# Patient Record
Sex: Male | Born: 1955 | Race: White | Hispanic: No | Marital: Married
Health system: Midwestern US, Academic
[De-identification: ages and names within clinical notes are randomized; demographics above are authoritative.]

---

## 2017-10-07 ENCOUNTER — Encounter: Admit: 2017-10-07 | Discharge: 2017-10-07 | Payer: MEDICARE

## 2017-10-09 MED ORDER — LISINOPRIL 20 MG PO TAB
20 mg | ORAL_TABLET | Freq: Every day | ORAL | 2 refills | Status: AC
Start: 2017-10-09 — End: 2017-12-28

## 2017-12-14 ENCOUNTER — Encounter: Admit: 2017-12-14 | Discharge: 2017-12-14 | Payer: MEDICARE

## 2017-12-28 ENCOUNTER — Encounter: Admit: 2017-12-28 | Discharge: 2017-12-28 | Payer: MEDICARE

## 2017-12-28 DIAGNOSIS — R002 Palpitations: ICD-10-CM

## 2017-12-28 DIAGNOSIS — E785 Hyperlipidemia, unspecified: Principal | ICD-10-CM

## 2017-12-28 DIAGNOSIS — I10 Essential (primary) hypertension: ICD-10-CM

## 2017-12-28 MED ORDER — LISINOPRIL 20 MG PO TAB
ORAL_TABLET | Freq: Every day | 0 refills | Status: AC
Start: 2017-12-28 — End: 2018-01-19

## 2017-12-29 ENCOUNTER — Encounter: Admit: 2017-12-29 | Discharge: 2017-12-29 | Payer: MEDICARE

## 2018-01-16 ENCOUNTER — Encounter: Admit: 2018-01-16 | Discharge: 2018-01-16 | Payer: MEDICARE

## 2018-01-19 ENCOUNTER — Encounter: Admit: 2018-01-19 | Discharge: 2018-01-19 | Payer: MEDICARE

## 2018-01-19 MED ORDER — LISINOPRIL 20 MG PO TAB
ORAL_TABLET | Freq: Every day | 0 refills | Status: AC
Start: 2018-01-19 — End: 2018-02-13

## 2018-02-13 ENCOUNTER — Encounter: Admit: 2018-02-13 | Discharge: 2018-02-13 | Payer: MEDICARE

## 2018-02-13 MED ORDER — LISINOPRIL 20 MG PO TAB
ORAL_TABLET | Freq: Every day | 0 refills | Status: AC
Start: 2018-02-13 — End: 2018-04-16

## 2018-02-14 LAB — COMPREHENSIVE METABOLIC PANEL: Lab: 136

## 2018-02-15 LAB — BASIC METABOLIC PANEL: Lab: 138 — ABNORMAL LOW (ref 4.70–6.10)

## 2018-02-15 LAB — TROPONIN-I

## 2018-02-15 LAB — CBC: Lab: 7.9

## 2018-02-16 ENCOUNTER — Encounter: Admit: 2018-02-16 | Discharge: 2018-02-16 | Payer: MEDICARE

## 2018-02-16 DIAGNOSIS — Z950 Presence of cardiac pacemaker: Principal | ICD-10-CM

## 2018-02-23 ENCOUNTER — Ambulatory Visit: Admit: 2018-02-23 | Discharge: 2018-02-24 | Payer: MEDICARE

## 2018-02-23 DIAGNOSIS — Z950 Presence of cardiac pacemaker: Principal | ICD-10-CM

## 2018-03-01 ENCOUNTER — Encounter: Admit: 2018-03-01 | Discharge: 2018-03-01 | Payer: MEDICARE

## 2018-03-01 DIAGNOSIS — Z950 Presence of cardiac pacemaker: Principal | ICD-10-CM

## 2018-03-08 ENCOUNTER — Encounter: Admit: 2018-03-08 | Discharge: 2018-03-08 | Payer: MEDICARE

## 2018-03-08 ENCOUNTER — Ambulatory Visit: Admit: 2018-03-08 | Discharge: 2018-03-09 | Payer: MEDICARE

## 2018-03-08 DIAGNOSIS — Z950 Presence of cardiac pacemaker: ICD-10-CM

## 2018-03-08 DIAGNOSIS — R079 Chest pain, unspecified: Principal | ICD-10-CM

## 2018-03-08 DIAGNOSIS — G20 Parkinson's disease: ICD-10-CM

## 2018-03-08 DIAGNOSIS — R002 Palpitations: ICD-10-CM

## 2018-03-08 DIAGNOSIS — E785 Hyperlipidemia, unspecified: ICD-10-CM

## 2018-03-08 DIAGNOSIS — G4730 Sleep apnea, unspecified: ICD-10-CM

## 2018-03-08 DIAGNOSIS — I471 Supraventricular tachycardia: Principal | ICD-10-CM

## 2018-03-08 DIAGNOSIS — F79 Unspecified intellectual disabilities: ICD-10-CM

## 2018-03-08 DIAGNOSIS — I10 Essential (primary) hypertension: ICD-10-CM

## 2018-03-08 MED ORDER — METOPROLOL TARTRATE 25 MG PO TAB
25 mg | ORAL_TABLET | Freq: Two times a day (BID) | ORAL | 3 refills | 90.00000 days | Status: AC
Start: 2018-03-08 — End: 2018-05-22

## 2018-03-28 ENCOUNTER — Encounter: Admit: 2018-03-28 | Discharge: 2018-03-28 | Payer: MEDICARE

## 2018-03-28 ENCOUNTER — Ambulatory Visit: Admit: 2018-03-28 | Discharge: 2018-03-29 | Payer: MEDICARE

## 2018-03-28 DIAGNOSIS — I471 Supraventricular tachycardia: Principal | ICD-10-CM

## 2018-03-28 LAB — LIPID PROFILE
Lab: 127
Lab: 132
Lab: 3.6
Lab: 37 — ABNORMAL LOW (ref 40–60)
Lab: 70
Lab: 95

## 2018-03-28 MED ORDER — REGADENOSON 0.4 MG/5 ML IV SYRG
.4 mg | Freq: Once | INTRAVENOUS | 0 refills | Status: CP
Start: 2018-03-28 — End: ?

## 2018-03-28 MED ORDER — PERFLUTREN LIPID MICROSPHERES 1.1 MG/ML IV SUSP
1-20 mL | Freq: Once | INTRAVENOUS | 0 refills | Status: CP | PRN
Start: 2018-03-28 — End: ?

## 2018-03-29 ENCOUNTER — Encounter: Admit: 2018-03-29 | Discharge: 2018-03-29 | Payer: MEDICARE

## 2018-03-30 ENCOUNTER — Encounter: Admit: 2018-03-30 | Discharge: 2018-03-30 | Payer: MEDICARE

## 2018-04-11 LAB — COMPREHENSIVE METABOLIC PANEL
Lab: 0.3
Lab: 98

## 2018-04-12 LAB — BASIC METABOLIC PANEL
Lab: 1.2
Lab: 104
Lab: 138
Lab: 14
Lab: 19
Lab: 24
Lab: 4.1
Lab: 63
Lab: 9.6
Lab: 99

## 2018-04-14 ENCOUNTER — Encounter: Admit: 2018-04-14 | Discharge: 2018-04-14 | Payer: MEDICARE

## 2018-04-16 MED ORDER — LISINOPRIL 20 MG PO TAB
ORAL_TABLET | Freq: Every day | 11 refills | Status: AC
Start: 2018-04-16 — End: 2019-05-14

## 2018-04-27 ENCOUNTER — Encounter: Admit: 2018-04-27 | Discharge: 2018-04-27 | Payer: MEDICARE

## 2018-05-10 ENCOUNTER — Encounter: Admit: 2018-05-10 | Discharge: 2018-05-10 | Payer: MEDICARE

## 2018-05-10 ENCOUNTER — Ambulatory Visit: Admit: 2018-05-10 | Discharge: 2018-05-11 | Payer: MEDICARE

## 2018-05-10 DIAGNOSIS — E785 Hyperlipidemia, unspecified: ICD-10-CM

## 2018-05-10 DIAGNOSIS — R001 Bradycardia, unspecified: Principal | ICD-10-CM

## 2018-05-10 DIAGNOSIS — G20 Parkinson's disease: ICD-10-CM

## 2018-05-10 DIAGNOSIS — R079 Chest pain, unspecified: Principal | ICD-10-CM

## 2018-05-10 DIAGNOSIS — F79 Unspecified intellectual disabilities: ICD-10-CM

## 2018-05-10 DIAGNOSIS — G4730 Sleep apnea, unspecified: ICD-10-CM

## 2018-05-10 DIAGNOSIS — R002 Palpitations: ICD-10-CM

## 2018-05-10 DIAGNOSIS — I10 Essential (primary) hypertension: ICD-10-CM

## 2018-05-11 ENCOUNTER — Encounter: Admit: 2018-05-11 | Discharge: 2018-05-11 | Payer: MEDICARE

## 2018-05-15 ENCOUNTER — Ambulatory Visit: Admit: 2018-05-15 | Discharge: 2018-05-16 | Payer: MEDICARE

## 2018-05-15 ENCOUNTER — Encounter: Admit: 2018-05-15 | Discharge: 2018-05-15 | Payer: MEDICARE

## 2018-05-15 DIAGNOSIS — Z950 Presence of cardiac pacemaker: Principal | ICD-10-CM

## 2018-05-16 ENCOUNTER — Encounter: Admit: 2018-05-16 | Discharge: 2018-05-16 | Payer: MEDICARE

## 2018-05-16 DIAGNOSIS — R001 Bradycardia, unspecified: Principal | ICD-10-CM

## 2018-05-22 ENCOUNTER — Encounter: Admit: 2018-05-22 | Discharge: 2018-05-22 | Payer: MEDICARE

## 2018-05-22 MED ORDER — METOPROLOL TARTRATE 25 MG PO TAB
ORAL_TABLET | Freq: Two times a day (BID) | ORAL | 3 refills | 90.00000 days | Status: AC
Start: 2018-05-22 — End: 2019-02-28

## 2018-11-13 ENCOUNTER — Ambulatory Visit: Admit: 2018-11-13 | Discharge: 2018-11-14 | Payer: MEDICARE

## 2018-11-13 ENCOUNTER — Encounter: Admit: 2018-11-13 | Discharge: 2018-11-13 | Payer: MEDICARE

## 2018-11-13 DIAGNOSIS — R001 Bradycardia, unspecified: Principal | ICD-10-CM

## 2018-11-15 ENCOUNTER — Ambulatory Visit: Admit: 2018-11-15 | Discharge: 2018-11-15 | Payer: MEDICARE

## 2018-11-15 ENCOUNTER — Encounter: Admit: 2018-11-15 | Discharge: 2018-11-15 | Payer: MEDICARE

## 2018-11-15 DIAGNOSIS — I10 Essential (primary) hypertension: ICD-10-CM

## 2018-11-15 DIAGNOSIS — R002 Palpitations: ICD-10-CM

## 2018-11-15 DIAGNOSIS — Z950 Presence of cardiac pacemaker: ICD-10-CM

## 2018-11-15 DIAGNOSIS — G20 Parkinson's disease: ICD-10-CM

## 2018-11-15 DIAGNOSIS — R079 Chest pain, unspecified: Principal | ICD-10-CM

## 2018-11-15 DIAGNOSIS — E785 Hyperlipidemia, unspecified: ICD-10-CM

## 2018-11-15 DIAGNOSIS — G4730 Sleep apnea, unspecified: ICD-10-CM

## 2018-11-15 DIAGNOSIS — F79 Unspecified intellectual disabilities: ICD-10-CM

## 2018-11-15 NOTE — Progress Notes
medications listed on this note should be accurate according to the patient and his caregiver. ???The patient reports no adverse effects to any the medications that he is currently taking.???         Vitals:    11/15/18 1439 11/15/18 1458   BP: 112/80 110/78   BP Source: Arm, Left Upper Arm, Right Upper   Pulse: 82    SpO2: 98%    Weight: 82.8 kg (182 lb 9.6 oz)    Height: 1.702 m (5' 7)    PainSc: Zero      Body mass index is 28.6 kg/m???.     Past Medical History  Patient Active Problem List    Diagnosis Date Noted   ??? Chest pain 06/14/2016   ??? Palpitations 06/14/2016   ??? Essential hypertension 06/14/2016   ??? Hyperlipidemia 06/14/2016   ??? Mental retardation 06/14/2016   ??? Parkinson disease (HCC) 06/14/2016   ??? Sleep apnea 06/14/2016     05/05/2015 Polysomnography with CPAP/BiPAP titration  Moderate Obstructive Sleep Apnea     ??? Pacemaker 06/14/2016     07/10/15 Insertion of dual chamber pacemaker implantation for Symptomatic sinus bradycardia  Medtronic Model: Advisa DR MRI ZO:[id number]   Atrial Lead: Medtronic Model: 5076-52cm, JW:[id number]  Ventricular Lead: Medtronic Model: 5076-58cm AO:[id number]      ??? Conductive hearing loss 07/31/2014         Review of Systems   Constitution: Positive for malaise/fatigue.   HENT: Negative.    Eyes: Negative.    Cardiovascular: Positive for dyspnea on exertion.   Respiratory: Negative.    Endocrine: Negative.    Hematologic/Lymphatic: Negative.    Skin: Negative.    Musculoskeletal: Negative.    Gastrointestinal: Negative.    Genitourinary: Negative.    Neurological: Positive for dizziness.   Psychiatric/Behavioral: Negative.    Allergic/Immunologic: Negative.        Physical Exam  GENERAL: The patient is well developed, well nourished, resting comfortably and in no distress.   HEENT: No abnormalities of the visible oro-nasopharynx, conjunctiva or sclera are noted.  NECK: There is no jugular venous distension. Carotids are palpable and given a requisition to obtain a Chem-7.  I have asked him to maintain close follow-up with his primary care physician. I have asked Mr. Carelock to return for follow-up in approximately 6 months time.         Current Medications (including today's revisions)  ??? aspirin EC 81 mg tablet Take 1 tablet by mouth daily. Take with food.   ??? carbidopa/levodopa (SINEMET) 25/100 mg tablet Take 1 tablet by mouth four times daily.   ??? Cholecalciferol (Vitamin D3) 1,000 unit cap Take 1 capsule by mouth daily.   ??? coenzyme Q10 100 mg cap Take 100 mg by mouth daily.   ??? diclofenac sodium DR (VOLTAREN) 50 mg tablet Take 50 mg by mouth twice daily.   ??? ferrous sulfate (FEOSOL, FEROSUL) 325 mg (65 mg iron) tablet Take 325 mg by mouth daily.   ??? hydroCHLOROthiazide (HYDRODIURIL) 12.5 mg capsule Take 12.5 mg by mouth every morning.   ??? lamoTRIgine (LAMICTAL) 100 mg tablet Take 100 mg by mouth daily.   ??? LINZESS 145 mcg cap capsule Take 1 capsule by mouth daily.   ??? lisinopril (PRINIVIL; ZESTRIL) 20 mg tablet TAKE 1 TABLET BY MOUTH EVERY DAY--NEED APPOINTMENT FOR MORE REFILLS   ??? meclizine (ANTIVERT) 25 mg tablet Take 1 tablet by mouth daily.   ??? metoprolol tartrate (LOPRESSOR) 25 mg tablet TAKE 1 TABLET  BY MOUTH TWICE A DAY   ??? MULTIVITAMIN PO Take 1 tablet by mouth daily.   ??? ondansetron (ZOFRAN ODT) 4 mg rapid dissolve tablet Dissolve 4 mg by mouth four times daily as needed for Nausea or Vomiting. Place on tongue to disolve.   ??? pantoprazole DR (PROTONIX) 40 mg tablet Take 1 tablet by mouth twice daily.   ??? rosuvastatin (CRESTOR) 20 mg tablet Take 20 mg by mouth daily.

## 2019-02-27 ENCOUNTER — Encounter: Admit: 2019-02-27 | Discharge: 2019-02-27 | Payer: MEDICARE

## 2019-02-28 MED ORDER — METOPROLOL TARTRATE 25 MG PO TAB
ORAL_TABLET | Freq: Two times a day (BID) | ORAL | 3 refills | 90.00000 days | Status: DC
Start: 2019-02-28 — End: 2020-02-12

## 2019-04-16 ENCOUNTER — Ambulatory Visit: Admit: 2019-04-16 | Discharge: 2019-04-17

## 2019-04-16 ENCOUNTER — Encounter: Admit: 2019-04-16 | Discharge: 2019-04-16

## 2019-04-16 DIAGNOSIS — R002 Palpitations: Secondary | ICD-10-CM

## 2019-04-16 DIAGNOSIS — Z950 Presence of cardiac pacemaker: Secondary | ICD-10-CM

## 2019-05-09 ENCOUNTER — Encounter: Admit: 2019-05-09 | Discharge: 2019-05-09

## 2019-05-14 ENCOUNTER — Encounter: Admit: 2019-05-14 | Discharge: 2019-05-14

## 2019-05-14 ENCOUNTER — Ambulatory Visit: Admit: 2019-05-14 | Discharge: 2019-05-15

## 2019-05-14 DIAGNOSIS — F79 Unspecified intellectual disabilities: Secondary | ICD-10-CM

## 2019-05-14 DIAGNOSIS — R002 Palpitations: Secondary | ICD-10-CM

## 2019-05-14 DIAGNOSIS — I10 Essential (primary) hypertension: Secondary | ICD-10-CM

## 2019-05-14 DIAGNOSIS — R001 Bradycardia, unspecified: Secondary | ICD-10-CM

## 2019-05-14 DIAGNOSIS — M940 Chondrocostal junction syndrome [Tietze]: Secondary | ICD-10-CM

## 2019-05-14 DIAGNOSIS — R079 Chest pain, unspecified: Secondary | ICD-10-CM

## 2019-05-14 DIAGNOSIS — G4730 Sleep apnea, unspecified: Secondary | ICD-10-CM

## 2019-05-14 DIAGNOSIS — E785 Hyperlipidemia, unspecified: Secondary | ICD-10-CM

## 2019-05-14 DIAGNOSIS — G20 Parkinson's disease: Secondary | ICD-10-CM

## 2020-02-08 ENCOUNTER — Encounter: Admit: 2020-02-08 | Discharge: 2020-02-08 | Payer: MEDICARE

## 2020-02-11 ENCOUNTER — Encounter: Admit: 2020-02-11 | Discharge: 2020-02-11 | Payer: MEDICARE

## 2020-02-11 DIAGNOSIS — G4730 Sleep apnea, unspecified: Secondary | ICD-10-CM

## 2020-02-11 DIAGNOSIS — R002 Palpitations: Secondary | ICD-10-CM

## 2020-02-11 DIAGNOSIS — I10 Essential (primary) hypertension: Secondary | ICD-10-CM

## 2020-02-11 DIAGNOSIS — E785 Hyperlipidemia, unspecified: Secondary | ICD-10-CM

## 2020-02-11 DIAGNOSIS — G20 Parkinson's disease: Secondary | ICD-10-CM

## 2020-02-11 DIAGNOSIS — R079 Chest pain, unspecified: Secondary | ICD-10-CM

## 2020-02-11 DIAGNOSIS — F79 Unspecified intellectual disabilities: Secondary | ICD-10-CM

## 2020-02-11 NOTE — Patient Instructions
Follow up as directed.  Call sooner if issues.  Call the [facility] nursing line at [phone].  Leave a detailed message for the nurse in [facility] with how we can assist you and we will call you back.
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
## 2020-02-12 ENCOUNTER — Encounter: Admit: 2020-02-12 | Discharge: 2020-02-12 | Payer: MEDICARE

## 2020-02-12 MED ORDER — METOPROLOL TARTRATE 25 MG PO TAB
ORAL_TABLET | Freq: Two times a day (BID) | ORAL | 6 refills | 90.00000 days | Status: AC
Start: 2020-02-12 — End: ?

## 2020-02-13 ENCOUNTER — Encounter: Admit: 2020-02-13 | Discharge: 2020-02-13 | Payer: MEDICARE

## 2020-04-15 ENCOUNTER — Encounter: Admit: 2020-04-15 | Discharge: 2020-04-15 | Payer: MEDICARE

## 2020-04-16 IMAGING — CR CHEST
1 series · 1 of 1 positions shown · non-contrast
Comparison: none

[chest port x-wise]
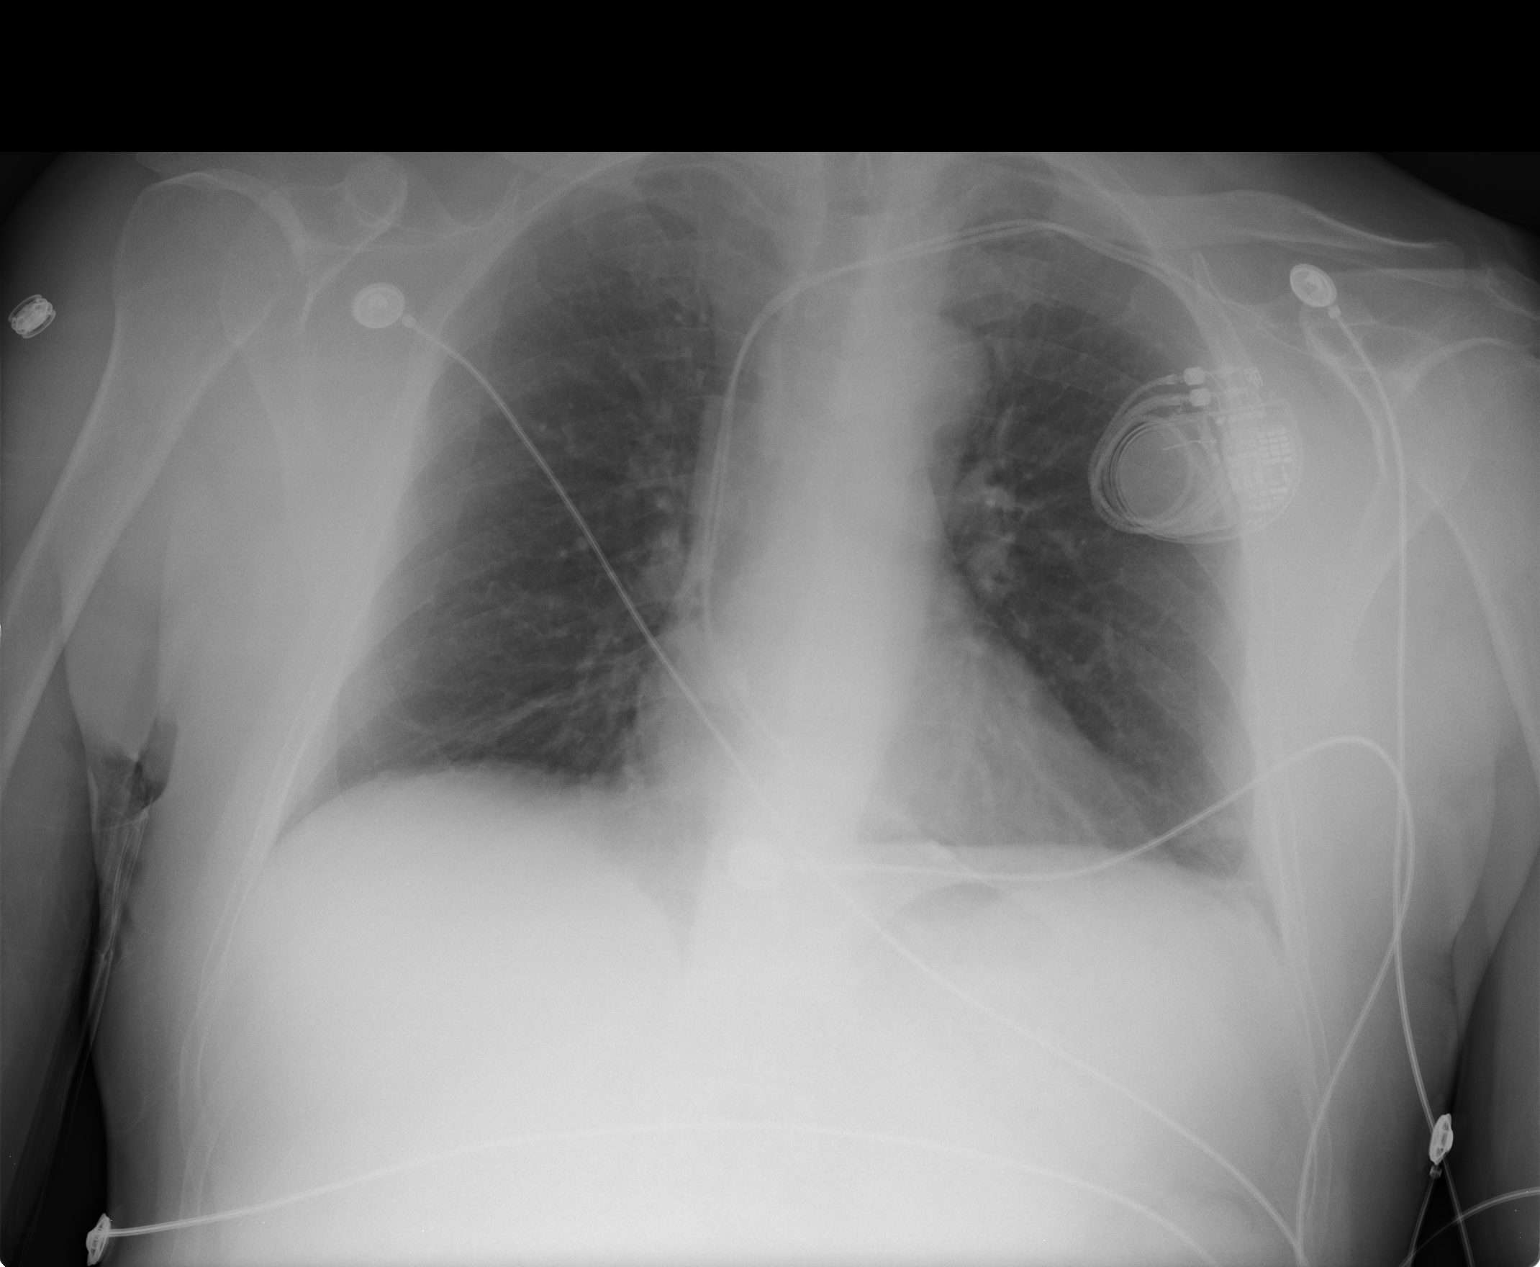

[1 of 1 positions shown; findings below may reference images not displayed]

DIAGNOSTIC STUDIES

EXAM

Single-view chest

INDICATION

Chest pain

TECHNIQUE

Frontal view

COMPARISONS

Chest radiograph February 14, 2018

FINDINGS

Cardiac conduction device redemonstrated. Heart and pulmonary vasculature within normal limits. No
dense pulmonary consolidation, pleural effusion, or pneumothorax.

IMPRESSION

Unchanged appearance of the chest without new acute cardiopulmonary abnormality.

Tech Notes:

chest pain.

## 2020-07-28 ENCOUNTER — Encounter: Admit: 2020-07-28 | Discharge: 2020-07-28 | Payer: MEDICARE

## 2020-07-28 ENCOUNTER — Ambulatory Visit: Admit: 2020-07-28 | Discharge: 2020-07-28 | Payer: MEDICARE

## 2020-07-28 DIAGNOSIS — I10 Essential (primary) hypertension: Secondary | ICD-10-CM

## 2020-07-28 DIAGNOSIS — R001 Bradycardia, unspecified: Secondary | ICD-10-CM

## 2020-07-28 DIAGNOSIS — R002 Palpitations: Secondary | ICD-10-CM

## 2020-07-28 DIAGNOSIS — Z950 Presence of cardiac pacemaker: Secondary | ICD-10-CM

## 2020-08-11 ENCOUNTER — Encounter: Admit: 2020-08-11 | Discharge: 2020-08-11 | Payer: MEDICARE

## 2020-08-11 DIAGNOSIS — R002 Palpitations: Secondary | ICD-10-CM

## 2020-08-11 DIAGNOSIS — I10 Essential (primary) hypertension: Secondary | ICD-10-CM

## 2020-08-11 DIAGNOSIS — E785 Hyperlipidemia, unspecified: Secondary | ICD-10-CM

## 2020-08-11 DIAGNOSIS — G4730 Sleep apnea, unspecified: Secondary | ICD-10-CM

## 2020-08-11 DIAGNOSIS — G20 Parkinson's disease: Secondary | ICD-10-CM

## 2020-08-11 DIAGNOSIS — F79 Unspecified intellectual disabilities: Secondary | ICD-10-CM

## 2020-08-11 DIAGNOSIS — R079 Chest pain, unspecified: Secondary | ICD-10-CM

## 2020-08-11 DIAGNOSIS — R001 Bradycardia, unspecified: Secondary | ICD-10-CM
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
## 2020-11-30 ENCOUNTER — Encounter: Admit: 2020-11-30 | Discharge: 2020-11-30 | Payer: MEDICARE

## 2020-11-30 ENCOUNTER — Ambulatory Visit: Admit: 2020-11-30 | Discharge: 2020-11-30 | Payer: MEDICARE

## 2020-11-30 DIAGNOSIS — G459 Transient cerebral ischemic attack, unspecified: Secondary | ICD-10-CM

## 2021-01-01 ENCOUNTER — Encounter: Admit: 2021-01-01 | Discharge: 2021-01-01 | Payer: MEDICARE

## 2021-01-01 MED ORDER — METOPROLOL TARTRATE 25 MG PO TAB
ORAL_TABLET | Freq: Two times a day (BID) | ORAL | 3 refills | 90.00000 days | Status: AC
Start: 2021-01-01 — End: ?

## 2021-02-11 ENCOUNTER — Encounter: Admit: 2021-02-11 | Discharge: 2021-02-11 | Payer: MEDICARE

## 2021-02-11 ENCOUNTER — Ambulatory Visit: Admit: 2021-02-11 | Discharge: 2021-02-11 | Payer: MEDICARE

## 2021-02-11 DIAGNOSIS — Z950 Presence of cardiac pacemaker: Secondary | ICD-10-CM

## 2021-02-11 DIAGNOSIS — R001 Bradycardia, unspecified: Secondary | ICD-10-CM

## 2021-04-06 ENCOUNTER — Encounter: Admit: 2021-04-06 | Discharge: 2021-04-06 | Payer: MEDICARE

## 2021-04-07 ENCOUNTER — Encounter: Admit: 2021-04-07 | Discharge: 2021-04-07 | Payer: MEDICARE

## 2021-07-13 ENCOUNTER — Ambulatory Visit: Admit: 2021-07-13 | Discharge: 2021-07-13 | Payer: MEDICARE

## 2021-07-13 ENCOUNTER — Encounter: Admit: 2021-07-13 | Discharge: 2021-07-13 | Payer: MEDICARE

## 2021-07-13 DIAGNOSIS — G459 Transient cerebral ischemic attack, unspecified: Secondary | ICD-10-CM

## 2021-08-31 ENCOUNTER — Encounter: Admit: 2021-08-31 | Discharge: 2021-08-31 | Payer: MEDICARE

## 2021-09-01 ENCOUNTER — Encounter: Admit: 2021-09-01 | Discharge: 2021-09-01 | Payer: MEDICARE

## 2021-09-01 DIAGNOSIS — Z950 Presence of cardiac pacemaker: Secondary | ICD-10-CM

## 2021-09-01 DIAGNOSIS — E785 Hyperlipidemia, unspecified: Secondary | ICD-10-CM

## 2021-09-01 DIAGNOSIS — I10 Essential (primary) hypertension: Secondary | ICD-10-CM

## 2021-09-09 ENCOUNTER — Encounter: Admit: 2021-09-09 | Discharge: 2021-09-09 | Payer: MEDICARE

## 2021-09-09 DIAGNOSIS — Z950 Presence of cardiac pacemaker: Secondary | ICD-10-CM

## 2021-09-09 DIAGNOSIS — Z136 Encounter for screening for cardiovascular disorders: Secondary | ICD-10-CM

## 2021-09-09 DIAGNOSIS — R079 Chest pain, unspecified: Secondary | ICD-10-CM

## 2021-09-09 DIAGNOSIS — E785 Hyperlipidemia, unspecified: Secondary | ICD-10-CM

## 2021-09-09 DIAGNOSIS — G4730 Sleep apnea, unspecified: Secondary | ICD-10-CM

## 2021-09-09 DIAGNOSIS — I10 Essential (primary) hypertension: Secondary | ICD-10-CM

## 2021-09-09 DIAGNOSIS — R002 Palpitations: Secondary | ICD-10-CM

## 2021-09-09 DIAGNOSIS — G20 Parkinson's disease: Secondary | ICD-10-CM

## 2021-09-09 DIAGNOSIS — F79 Unspecified intellectual disabilities: Secondary | ICD-10-CM
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
## 2021-09-16 ENCOUNTER — Encounter: Admit: 2021-09-16 | Discharge: 2021-09-16 | Payer: MEDICARE

## 2021-09-16 ENCOUNTER — Ambulatory Visit: Admit: 2021-09-16 | Discharge: 2021-09-16 | Payer: MEDICARE

## 2021-09-16 DIAGNOSIS — I10 Essential (primary) hypertension: Secondary | ICD-10-CM

## 2021-09-16 DIAGNOSIS — Z950 Presence of cardiac pacemaker: Secondary | ICD-10-CM

## 2021-09-16 DIAGNOSIS — E785 Hyperlipidemia, unspecified: Secondary | ICD-10-CM

## 2021-12-08 ENCOUNTER — Encounter: Admit: 2021-12-08 | Discharge: 2021-12-08 | Payer: MEDICARE

## 2021-12-08 MED ORDER — METOPROLOL TARTRATE 25 MG PO TAB
ORAL_TABLET | 3 refills
Start: 2021-12-08 — End: ?

## 2021-12-28 ENCOUNTER — Encounter: Admit: 2021-12-28 | Discharge: 2021-12-28 | Payer: MEDICARE

## 2021-12-28 NOTE — Telephone Encounter
Received msg on nurse line from [place] with [employer] [facility] [place] stating pt is scheduled for a dental procedure next week and she would like a call back to determine if he is pacer dependant, what his underlying rhythm is, and the date of his last PM interrogation. She left call back # of [phone].     Chart review indicates pt is not device dependant, his last PM interrogation completed in our office was done by device rep on 09/16/21. The last documented check of his underlying rhythm was completed 02/11/21 and it was NSR 60-70s at that time. Notified Almira Coaster of this information and she denied any additional needs.

## 2022-04-12 ENCOUNTER — Encounter: Admit: 2022-04-12 | Discharge: 2022-04-12 | Payer: MEDICARE

## 2022-04-12 ENCOUNTER — Ambulatory Visit: Admit: 2022-04-12 | Discharge: 2022-04-12 | Payer: MEDICARE

## 2022-04-12 DIAGNOSIS — Z950 Presence of cardiac pacemaker: Secondary | ICD-10-CM

## 2022-08-15 ENCOUNTER — Encounter: Admit: 2022-08-15 | Discharge: 2022-08-15 | Payer: MEDICARE

## 2022-08-15 DIAGNOSIS — Z8673 Personal history of transient ischemic attack (TIA), and cerebral infarction without residual deficits: Secondary | ICD-10-CM

## 2022-08-26 ENCOUNTER — Encounter: Admit: 2022-08-26 | Discharge: 2022-08-26 | Payer: MEDICARE

## 2022-08-26 MED ORDER — METOPROLOL TARTRATE 25 MG PO TAB
ORAL_TABLET | 3 refills
Start: 2022-08-26 — End: ?

## 2022-08-30 ENCOUNTER — Encounter: Admit: 2022-08-30 | Discharge: 2022-08-30 | Payer: MEDICARE

## 2022-08-30 DIAGNOSIS — Z950 Presence of cardiac pacemaker: Secondary | ICD-10-CM

## 2022-08-30 NOTE — Telephone Encounter
-----   Message from Sherian Maroon, RN sent at 08/30/2022  1:37 PM CST -----  Regarding: RE: MRI  11/17/21 will work.  CXR needs to be viewable in [facility] system.  I will place orders.  "MRI SMARTSYNC-CXR & device check 1st. "   ----- Message -----  From: Jake Shark  Sent: 08/30/2022   1:12 PM CST  To: [facility]  Subject: RE: MRI                                          2/15 1015 BELL - I WILL CALL [facility] TO SEE IF THEY CAN DO A CXR PRIOR  ----- Message -----  From: Sherian Maroon, RN  Sent: 08/30/2022   9:27 AM CST  To: Trula Ore Raley-Gordon; [facility]  Subject: RE: MRI                                          This appears to be 1.5 and 3T Conditional and can be SmartSync but needs CXR prior, which, if he could get now vs later would be helpful.  Date?   ----- Message -----  From: Jake Shark  Sent: 08/30/2022   8:13 AM CST  To: [facility]  Subject: MRI                                              MRN: [id number]     Name: Bradley Mills     Exam:  MRI Head     Ordering physician: Herschell Dimes    Expected date or time frame: ROUTINE     Paste device information below:   Patient Notes: 04/12/22-no remote; NSVT < 3 seconds seen on 01/12/22 with check reported to pt's primary.     Pacemaker Dependant: no  On Anti-coag: no  MRI Conditional: yes              GENERATOR ATRIAL LEAD RV LEAD LV LEAD  Manufacturer: Medtronic  Medtronic  Medtronic      Model NumberRosalio Macadamia DR MRI [id number] 5076-58cm    Serial Number: [id number]    Implant Date: -- 10/2,016 10/2,016    Device Type: IPG        [facility]

## 2022-08-31 ENCOUNTER — Encounter: Admit: 2022-08-31 | Discharge: 2022-08-31 | Payer: MEDICARE

## 2022-09-28 ENCOUNTER — Encounter: Admit: 2022-09-28 | Discharge: 2022-09-28 | Payer: MEDICARE

## 2022-09-29 ENCOUNTER — Encounter: Admit: 2022-09-29 | Discharge: 2022-09-29 | Payer: MEDICARE

## 2022-10-04 ENCOUNTER — Encounter: Admit: 2022-10-04 | Discharge: 2022-10-04 | Payer: MEDICARE

## 2022-11-01 ENCOUNTER — Encounter: Admit: 2022-11-01 | Discharge: 2022-11-01 | Payer: MEDICARE

## 2022-11-01 ENCOUNTER — Ambulatory Visit: Admit: 2022-11-01 | Discharge: 2022-11-01 | Payer: MEDICARE

## 2022-11-01 DIAGNOSIS — Z950 Presence of cardiac pacemaker: Secondary | ICD-10-CM

## 2022-11-02 ENCOUNTER — Encounter: Admit: 2022-11-02 | Discharge: 2022-11-02 | Payer: MEDICARE

## 2022-11-02 NOTE — Telephone Encounter
Pt enrollment completed. New bedside monitor was ordered. BC

## 2022-11-02 NOTE — Telephone Encounter
-----   Message from Matilde Bash, RN sent at 11/02/2022 12:09 PM CST -----  Regarding: [facility] enroll  He has help now.  See my 11/01/22 device check.  Pls enroll, manual [facility]

## 2022-11-11 ENCOUNTER — Encounter: Admit: 2022-11-11 | Discharge: 2022-11-11 | Payer: MEDICARE

## 2022-11-11 ENCOUNTER — Ambulatory Visit: Admit: 2022-11-11 | Discharge: 2022-11-11 | Payer: MEDICARE

## 2022-11-11 DIAGNOSIS — Z136 Encounter for screening for cardiovascular disorders: Secondary | ICD-10-CM

## 2022-11-11 DIAGNOSIS — I10 Essential (primary) hypertension: Secondary | ICD-10-CM

## 2022-11-11 DIAGNOSIS — R002 Palpitations: Secondary | ICD-10-CM

## 2022-11-11 DIAGNOSIS — F79 Unspecified intellectual disabilities: Secondary | ICD-10-CM

## 2022-11-11 DIAGNOSIS — G4730 Sleep apnea, unspecified: Secondary | ICD-10-CM

## 2022-11-11 DIAGNOSIS — R079 Chest pain, unspecified: Secondary | ICD-10-CM

## 2022-11-11 DIAGNOSIS — E785 Hyperlipidemia, unspecified: Secondary | ICD-10-CM

## 2022-11-11 DIAGNOSIS — G20A1 Parkinson disease: Secondary | ICD-10-CM

## 2022-11-11 DIAGNOSIS — Z950 Presence of cardiac pacemaker: Secondary | ICD-10-CM

## 2022-11-11 NOTE — Progress Notes
Cardiovascular Medicine     Date of Service: 11/11/2022    HPI     Bradley Mills is a pleasant 67 year old male who last saw Dr. Arna Medici in December 2022.  He and his wife live in [place] and have 24/7 cargivers.  We are following him for for symptomatic sinus bradycardia.  His past medical history also includes chronic cognitive dysfunction ,chest pain, palpitations, essential hypertension, hyperlipidemia, Parkinson's disease, sleep apnea with CPAP, and dual-chamber pacemaker.    Today Bradley Mills is here with a care coordinator.  He has a full length walker that he uses consistently.  He has been seen several times by paramedics and in the emergency room for heart racing. The caretaker tells me this is associated with increased caffeine use and they are encouraging him to have 1 cup of coffee a day and no additional caffeine.  I do not have the emergency room records to review.    His care team has a medication list which has been updated to our records.  Today he denies episodes of chest pain, syncope, near syncope, or falls.  He is compliant with his pacemaker remote checks and has recently received a remote machine at his apartment.  I was able to review his lab work from [place] which included a CBC, magnesium CMP troponin.  Initial cardiac workup by Dr. Arna Medici revealed a stress test without acute abnormalities and an echocardiogram with normal pumping function and valvular functions         Vitals:    11/11/22 1107   BP: 121/88   BP Source: Arm, Right Upper   Pulse: 66   SpO2: 95%   O2 Device: None (Room air)   PainSc: Zero   Weight: 86.1 kg (189 lb 12.8 oz)   Height: 177.8 cm (5' 10)     Body mass index is 27.23 kg/m?Marland Kitchen     Past Medical History  Patient Active Problem List    Diagnosis Date Noted    Chest pain 06/14/2016    Palpitations 06/14/2016    Essential hypertension 06/14/2016    Hyperlipidemia 06/14/2016    Mental retardation 06/14/2016    Parkinson disease 06/14/2016    Sleep apnea 06/14/2016 05/05/2015 Polysomnography with CPAP/BiPAP titration  Moderate Obstructive Sleep Apnea      Pacemaker 06/14/2016     07/10/15 Insertion of dual chamber pacemaker implantation for Symptomatic sinus bradycardia  Medtronic Model: Advisa DR MRI [id number]   Atrial Lead: Medtronic Model: 5076-52cm, [id number]  Ventricular Lead: Medtronic Model: 5076-58cm VH:[id number]       Conductive hearing loss 07/31/2014         Review of Systems   All other systems reviewed and are negative.      Physical Exam:  General Appearance: no acute distress  Skin: warm & intact  Neck Veins: neck veins are flat & not distended  Carotid Arteries: no bruits  Chest Inspection: chest is normal in appearance  Auscultation/Percussion: lungs clear to auscultation, no rales, rhonchi, or wheezing  Cardiac Rhythm: regular rhythm & normal rate  Cardiac Auscultation: Normal S1 & S2.    Murmurs: no cardiac murmurs   Extremities: no lower extremity edema; 2+ symmetric distal pulses  Abdominal Exam: soft, non-tender, bowel sounds normal  Neurologic Exam: oriented to time, place and person; no focal neurologic deficits  Psychiatric: Normal mood and affect.  Behavior is normal. Judgment and thought content normal.        Cardiovascular Studies  Preliminary EKG from today:  Atrial paced  66 bpm  Most recent results for 12-Lead ECG   ECG 12-LEAD    Collection Time: 11/11/22 11:16 AM   Result Value Status    VENTRICULAR RATE 66 Incomplete    P-R INTERVAL 166 Incomplete    QRS DURATION 64 Incomplete    Q-T INTERVAL 392 Incomplete    QTC CALCULATION (BAZETT) 410 Incomplete    P AXIS 16 Incomplete    R AXIS 44 Incomplete    T AXIS 37 Incomplete    Impression    Atrial-paced rhythm  Abnormal ECG  When compared with ECG of 09-Sep-2021 13:23,  No significant change was found          Cardiovascular Health Factors  Vitals BP Readings from Last 3 Encounters:   11/11/22 121/88   09/09/21 (!) 128/94   07/13/21 118/75     Wt Readings from Last 3 Encounters: 11/11/22 86.1 kg (189 lb 12.8 oz)   09/09/21 83.9 kg (185 lb)   07/13/21 79 kg (174 lb 2.6 oz)     BMI Readings from Last 3 Encounters:   11/11/22 27.23 kg/m?   09/09/21 26.54 kg/m?   07/13/21 24.99 kg/m?      Smoking Social History     Tobacco Use   Smoking Status Former   Smokeless Tobacco Never      Lipid Profile Cholesterol   Date Value Ref Range Status   03/28/2018 132  Final     HDL   Date Value Ref Range Status   03/28/2018 37 (L) 40 - 60 Final     LDL   Date Value Ref Range Status   03/28/2018 70  Final     Triglycerides   Date Value Ref Range Status   03/28/2018 127  Final      Blood Sugar No results found for: HGBA1C  Glucose   Date Value Ref Range Status   06/28/2022 102  Final   08/04/2021 98  Final   06/25/2020 130  Final   09/15/2015 103  Final          Problems Addressed Today  Encounter Diagnoses   Name Primary?    Screening for heart disease     Essential hypertension     Pacemaker     Chest pain, unspecified type        Assessment and Plan       1. Bradley Mills appears stable from a cardiovascular perspective. If he has increasing episodes of palpitations we can order a cardiac monitor to be mailed to his apartment.    We will obtain the emergency room records to  learn more about these episodes.  His pacemaker review of his pacemaker checks do not indicate arrhythmia.  Although we do not have blood pressures from where he lives his blood pressure here today is 121/88 and he denies episodes of chest pain.  I do not see that a cholesterol panel has been redrawn and we will order this for the next time he has his labs.    Tiburcio Pea, APRN-CNS      Total Time Today was 30 minutes in the following activities: Preparing to see the patient, Obtaining and/or reviewing separately obtained history, and Performing a medically appropriate examination and/or evaluation      This note was partially dictated using Dragon Medical One speech recognition software.  Occasional wrong-word or sound-alike substitutions may have occurred due to the inherent limitations of voice-recognition software.  Please read the chart carefully and recognize, using  context, where the substitutions may have occurred.  Please do not hesitate to contact me for clarification.            Current Medications (including today's revisions)   aspirin 325 mg tablet Take one tablet by mouth daily. Take with food.    carbidopa/levodopa (SINEMET) 25/100 mg tablet Take one tablet by mouth four times daily.    Cholecalciferol (Vitamin D3) 1,000 unit cap Take one capsule by mouth daily.    clopiDOGreL (PLAVIX) 75 mg tablet Take one tablet by mouth daily.    coenzyme Q10 100 mg cap Take one capsule by mouth daily.    escitalopram oxalate (LEXAPRO) 10 mg tablet Take one tablet by mouth daily.    ferrous sulfate (FEOSOL, FEROSUL) 325 mg (65 mg iron) tablet Take one tablet by mouth daily.    hydroCHLOROthiazide (HYDRODIURIL) 12.5 mg capsule Take one capsule by mouth every morning.    lamoTRIgine (LAMICTAL) 150 mg tablet Take one tablet by mouth daily.    LINZESS 145 mcg cap capsule Take one capsule by mouth daily.    lisinopriL (ZESTRIL) 10 mg tablet Take one tablet by mouth daily.    meclizine (ANTIVERT) 25 mg tablet Take one tablet by mouth daily.    metoprolol tartrate 25 mg tablet TAKE 1 TABLET BY MOUTH TWICE A DAY FOR HYPERTENSION (EQ: LOPRESSOR)    MULTIVITAMIN PO Take 1 tablet by mouth daily.    pantoprazole DR (PROTONIX) 40 mg tablet Take one tablet by mouth twice daily.    polyethylene glycol 3350 (MIRALAX) 17 g packet Take one packet by mouth daily.    rosuvastatin (CRESTOR) 20 mg tablet Take one tablet by mouth daily.

## 2022-11-17 ENCOUNTER — Encounter: Admit: 2022-11-17 | Discharge: 2022-11-17 | Payer: MEDICARE

## 2022-11-17 ENCOUNTER — Ambulatory Visit: Admit: 2022-11-17 | Discharge: 2022-11-17 | Payer: MEDICARE

## 2022-11-17 DIAGNOSIS — Z8673 Personal history of transient ischemic attack (TIA), and cerebral infarction without residual deficits: Secondary | ICD-10-CM

## 2022-11-17 DIAGNOSIS — Z950 Presence of cardiac pacemaker: Secondary | ICD-10-CM

## 2022-11-17 MED ORDER — GADOBENATE DIMEGLUMINE 529 MG/ML (0.1MMOL/0.2ML) IV SOLN
17 mL | Freq: Once | INTRAVENOUS | 0 refills | Status: CP
Start: 2022-11-17 — End: ?
  Administered 2022-11-17: 16:00:00 17 mL via INTRAVENOUS

## 2022-11-17 NOTE — Progress Notes
[facility]  MRI Recommendations for [place] Cardiac Device Patient Conditional MRI     Device Information:       Patient: Bradley Mills MRN: [id number]   Date of Birth: [birth year] Anticipated Date of MRI: 11/17/22   //02/15  MRI SYSTEM CONDITIONS    Patient has a complete MRI ready system with MR Conditional generator and lead(s) Leads implanted > 6 weeks prior to MRI:                         [x]   Yes    []  No      Device FDA approved to undergo MRI Scan at:  [x]  1.5T    [x]  3T     DEVICE IMAGING   If patient's device is not followed by [facility], images have been obtained at a [facility] facility and reviewed within the past year and since last revision.    Chest X-ray/ CT has been reviewed since implantation (or revision of the leads) of the device/ lead(s):  [x]   Yes   []  No      Generator is implanted in the pectoral region:  [x]   Yes / Leadless []  No      The system is absent of lead extenders, lead adapters, broken leads, permanent epicardial leads, and abandoned leads: [x]   Yes  []  No      DEVICE EVALUATION   If patient's device is not followed by [facility], a device check has been obtained at a  facility and reviewed within the past six months.     [x]   Patient meets eligibility for SmartSync SureScan.  This section does NOT need completed; the application will collect relevant device data at the time of the MRI.    []  Patient has an S-ICD. This section does NOT need completed.     Generator has sufficient battery (generator is not at end-of-life/ ERI/ EOS/ RRT):  []   Yes   []  No      Patient is historically NOT dependent (as defined by no escape greater than 40 bpm):  []   Correct   []  Incorrect   Lead impedance measurements are within the programmed lead impedance limits (For atrial and ventricular pacing leads: lead impedance 200-2,000 ohms.  For defibrillation leads: lead impedance 20-200 ohms): []   Yes   []  No      Patient tolerates a pacing output of 5.0 V at a pulse width of 1.0 ms []   Yes []  Assess at time of MRI   Pacing Percentages:   [x]  Atrial Pacing:  Not Applicable              [x]  Ventricular Pacing:  Not Applicable            Capture threshold < 3.5 V at a pulse width of 0.5 ms for RA and RV leads for devices that will be programmed to an asynchronous pacing mode []   Yes    []  No      Preliminary recommendations for PRE-MRI SCAN DEVICE SETTINGS*  (Full assessment of programming to be confirmed by device staff or representative at time of MRI)   Pacing rate selected to avoid competitive pacing (recommendations based on historical data). Tachy detections will be turned off for MRI via MRI mode programmed setting:    []  Pacing Mode: Not Applicable        []   Therapies Off    [x]  Utilize Medtronic SmartSync application to place patient in  SureScan mode**                     Images Reviewed and Recommendations Made in Collaboration with:  Sharman Cheek, APRN Date: 11/17/22     Assessment completed by: Truddie Coco, RN    *The [facility] Device Team has completed this assessment based on current available data in the patient's medical record.  If changes/ concerns are noted at the time of the MRI, the [facility] Device Staff/ Vendor Representative have the discretion to use best clinical judgement to ensure the safety of the patient.      **Medtronic SmartSync application has safety parameters in place.  If progression to positive SureScan meets lockout criteria, please contact the [facility] Device Team or Medtronic representative.  Please send report of final programming parameters sent to [facility] device team (Email: [email or web address] Fax: [phone])      [facility]    1-800-[email or web address]  [facility]     [email or web address]    [place]     [phone]  [email or web address]

## 2022-11-17 NOTE — Progress Notes
Procedure: MRI Head with and without contrast    Pacemaker Type: Conditional Medtronic    Order verified: Yes    Allergies reviewed: Yes    Patient history reviewed: Yes      Conditional Pacemaker: Minimum requirements, vital signs needed pre/post scan (See Doc Flowsheets for further).    Pt placed on MRI safe cardiac, BP and O2 monitors.     [id number]: Pre procedure Vital Signs (See Doc Flowsheets for further).    0943: MRI device settings placed by MRI team member Scientist, research (medical).    Program Mode: DOO    Pacing Mode: On    If pacing mode on Pacing Rate set at: 85     1026: Post scan device returned back to prior MRI device settings by MRI team member Scientist, research (medical).    1028: Post procedure Vital Signs (See Doc Flowsheets for further).

## 2022-11-30 ENCOUNTER — Encounter: Admit: 2022-11-30 | Discharge: 2022-11-30 | Payer: MEDICARE

## 2022-11-30 DIAGNOSIS — Z950 Presence of cardiac pacemaker: Secondary | ICD-10-CM

## 2023-03-01 ENCOUNTER — Encounter: Admit: 2023-03-01 | Discharge: 2023-03-01 | Payer: MEDICARE

## 2023-03-01 NOTE — Telephone Encounter
Patient was scheduled for a Medtronic Carelink on 02/23/23 that has not been received. Patient was instructed to send a manual transmission. Instructed if the transmitter does not appear to be working properly, they need to contact the device company directly. Patient was provided with that contact number. Requested the patient send [place] a MyChart message or contact our device nurses at [phone] to let us know after they have sent their transmission. LVM for pt to send in a transmission. BC      Note: Patient needs to send a manual transmission as we did not receive their scheduled remote interrogation.

## 2023-04-28 ENCOUNTER — Encounter: Admit: 2023-04-28 | Discharge: 2023-04-28 | Payer: MEDICARE

## 2023-05-12 ENCOUNTER — Encounter: Admit: 2023-05-12 | Discharge: 2023-05-12 | Payer: MEDICARE

## 2023-05-12 NOTE — Telephone Encounter
Bradley Mills with [employer] called stating she can't hook up his remote  Flag to [facility] coordinators to call and help her on Monday  [place] - [phone]

## 2023-07-13 ENCOUNTER — Encounter: Admit: 2023-07-13 | Discharge: 2023-07-13 | Payer: MEDICARE

## 2023-07-13 MED ORDER — METOPROLOL TARTRATE 25 MG PO TAB
ORAL_TABLET | ORAL | 3 refills | 90.00000 days | Status: AC
Start: 2023-07-13 — End: ?

## 2023-11-24 ENCOUNTER — Encounter: Admit: 2023-11-24 | Discharge: 2023-11-24 | Payer: MEDICARE

## 2023-11-24 DIAGNOSIS — Z950 Presence of cardiac pacemaker: Secondary | ICD-10-CM

## 2024-01-02 ENCOUNTER — Ambulatory Visit: Admit: 2024-01-02 | Discharge: 2024-01-02 | Payer: MEDICARE

## 2024-01-02 ENCOUNTER — Encounter: Admit: 2024-01-02 | Discharge: 2024-01-02 | Payer: MEDICARE

## 2024-01-10 ENCOUNTER — Encounter: Admit: 2024-01-10 | Discharge: 2024-01-10 | Payer: MEDICARE

## 2024-01-25 ENCOUNTER — Ambulatory Visit: Admit: 2024-01-25 | Discharge: 2024-01-26 | Payer: MEDICARE

## 2024-01-25 ENCOUNTER — Encounter: Admit: 2024-01-25 | Discharge: 2024-01-25 | Payer: MEDICARE

## 2024-01-25 DIAGNOSIS — Z136 Encounter for screening for cardiovascular disorders: Secondary | ICD-10-CM

## 2024-01-25 DIAGNOSIS — R002 Palpitations: Secondary | ICD-10-CM

## 2024-01-25 DIAGNOSIS — I10 Essential (primary) hypertension: Secondary | ICD-10-CM

## 2024-01-25 DIAGNOSIS — Z950 Presence of cardiac pacemaker: Secondary | ICD-10-CM

## 2024-01-25 DIAGNOSIS — R079 Chest pain, unspecified: Secondary | ICD-10-CM

## 2024-01-25 DIAGNOSIS — E785 Hyperlipidemia, unspecified: Secondary | ICD-10-CM
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
## 2024-01-25 NOTE — Patient Instructions
 Thank you for visiting our office today.    We would like to make the following medication adjustments:  NONE       Otherwise continue the same medications as you have been doing.          We will be pursuing the following tests after your appointment today:       Orders Placed This Encounter    ECG 12-LEAD    DEVICE EVALUATION - PPM (PERMANENT PACEMAKER)         We will plan to see you back in 6 months with a device check.  Please call us  in the meantime with any questions or concerns.        Please allow 5-7 business days for our providers to review your results. All normal results will go to MyChart. If you do not have Mychart, it is strongly recommended to get this so you can easily view all your results. If you do not have mychart, we will attempt to call you once with normal lab and testing results. If we cannot reach you by phone with normal results, we will send you a letter.  If you have not heard the results of your testing after one week please give us  a call.       Your Cardiovascular Medicine [place] Team Siegfried Dress, Towanda Fret, Prentice Brochure, and [place])  phone number is [phone].

## 2024-03-12 ENCOUNTER — Encounter: Admit: 2024-03-12 | Discharge: 2024-03-12 | Payer: MEDICARE

## 2024-03-19 ENCOUNTER — Encounter: Admit: 2024-03-19 | Discharge: 2024-03-19 | Payer: MEDICARE

## 2024-04-03 ENCOUNTER — Encounter: Admit: 2024-04-03 | Discharge: 2024-04-03 | Payer: MEDICARE

## 2024-04-03 ENCOUNTER — Ambulatory Visit: Admit: 2024-04-03 | Discharge: 2024-04-03 | Payer: MEDICARE

## 2024-04-12 IMAGING — CT BRAIN WO(Adult)
3 of 4 series · 14 of 47 positions shown, 16 images · non-contrast
Comparison: none

[Series 4: brain cor 5.00 hr40 s3 · coronal · 0.32mm/px · 3 of 44 slices shown]
[im 15/44  brain]
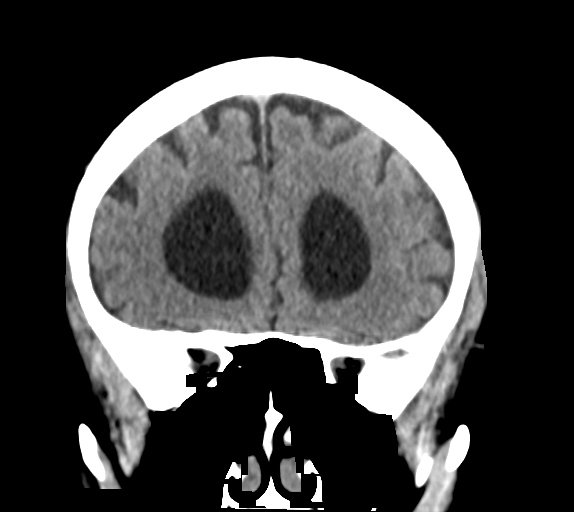
[im 20/44  brain]
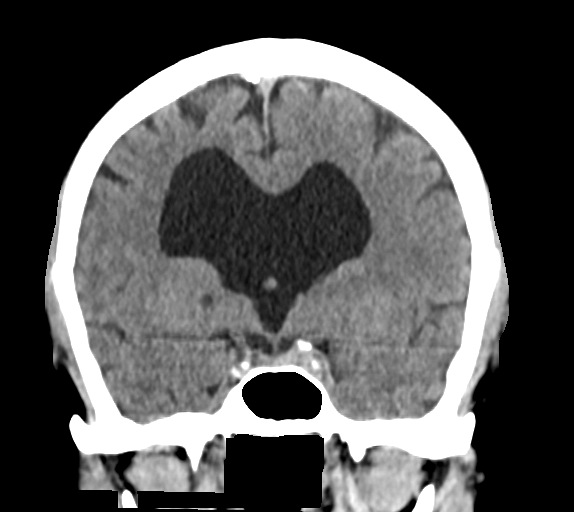
[im 24/44  brain]
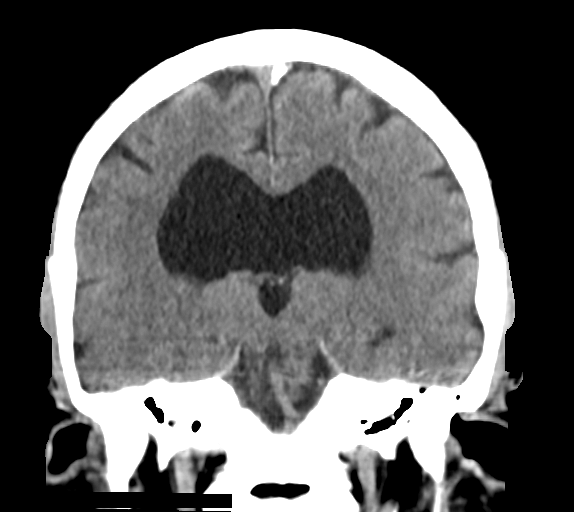

[Series 6: brain sag 5.00 hr40 s3 · sagittal · 0.32mm/px · 3 of 34 slices shown]
[im 12/34  brain]
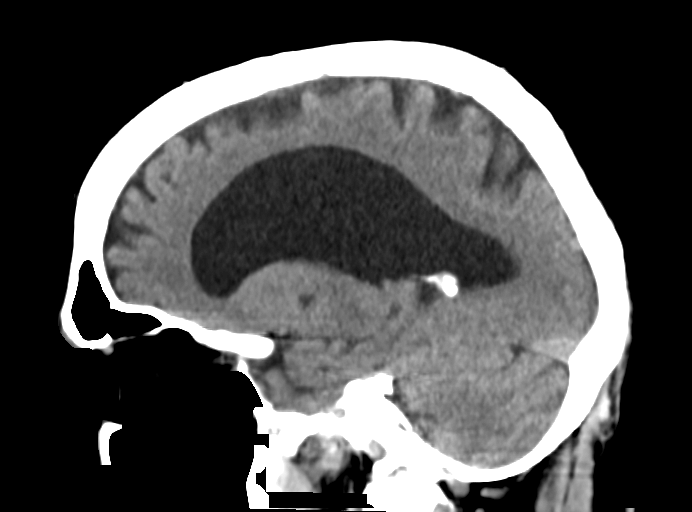
[im 17/34  brain]
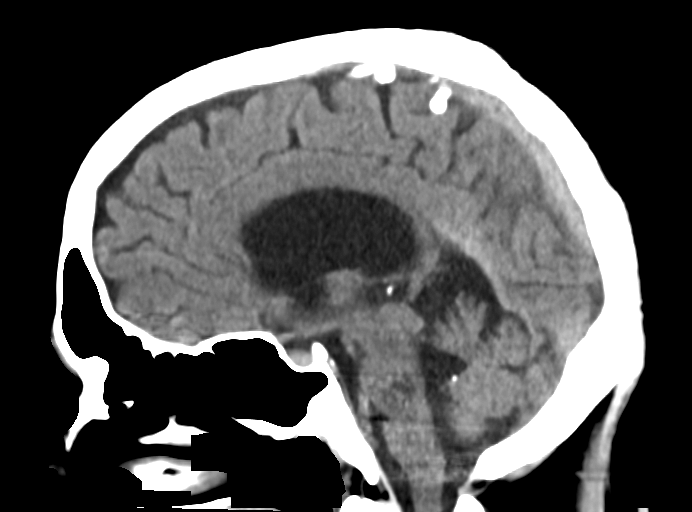
[im 23/34  brain]
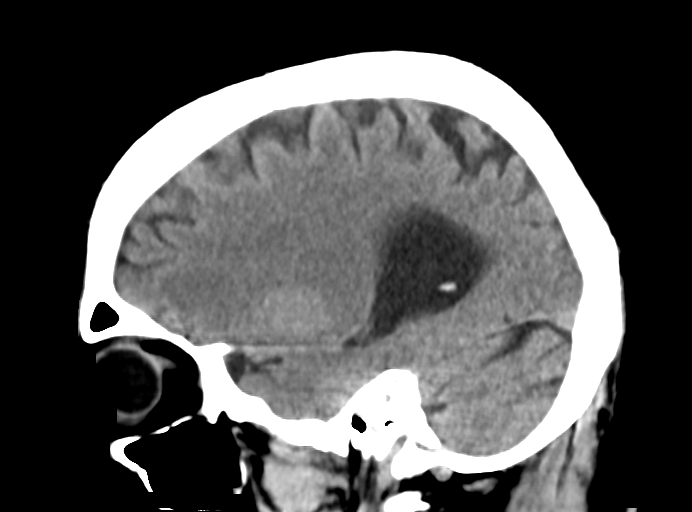

[Series 8: brain ax 2.00 hr60 s3 · axial · 0.36mm/px · z∈[-540,-410]mm · 8 of 78 slices shown, 10 images]
[im 8/78  brain]
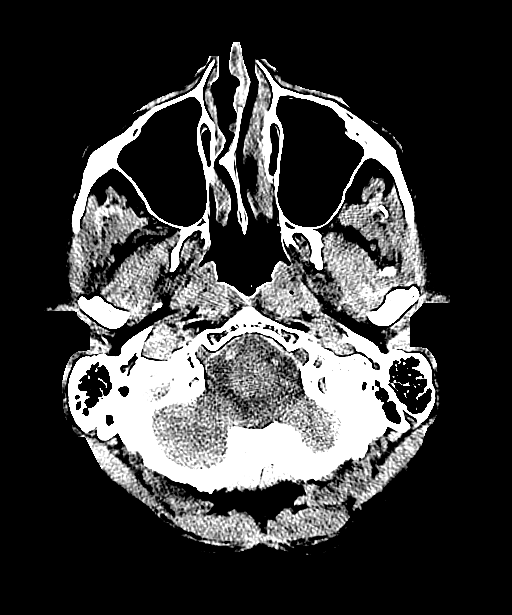
[im 8/78  bone]
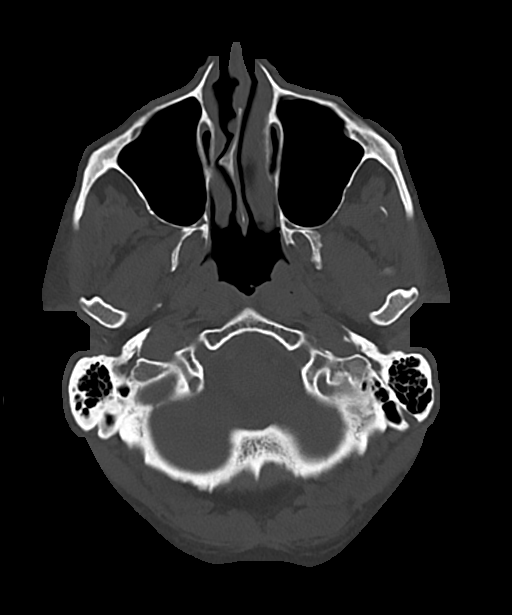
[im 16/78  brain]
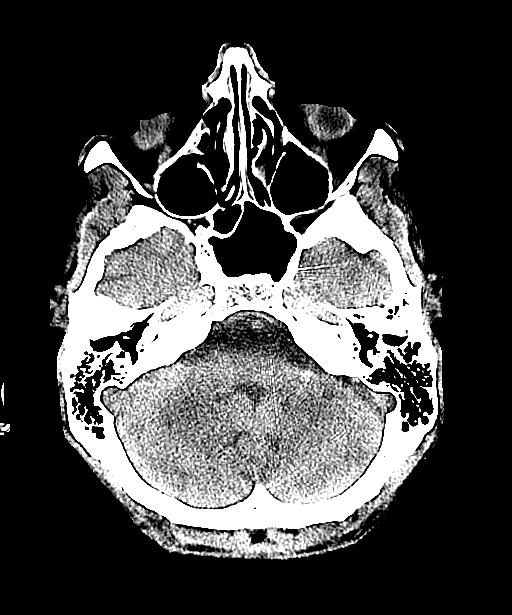
[im 24/78  brain]
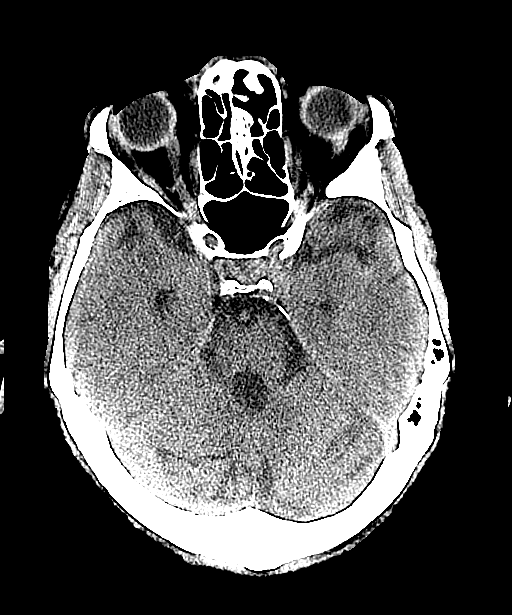
[im 35/78  brain]
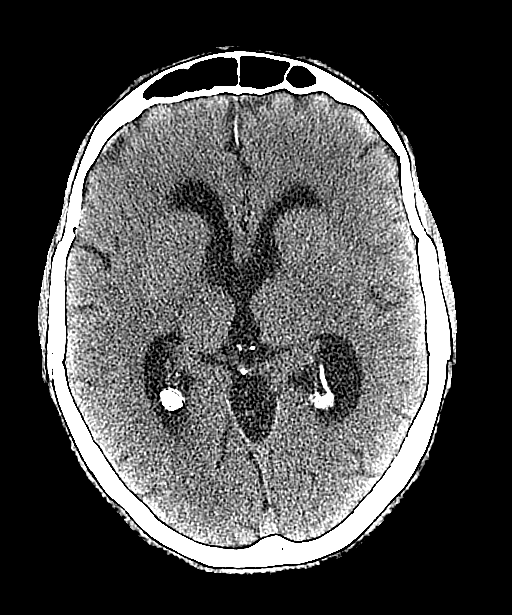
[im 43/78  brain]
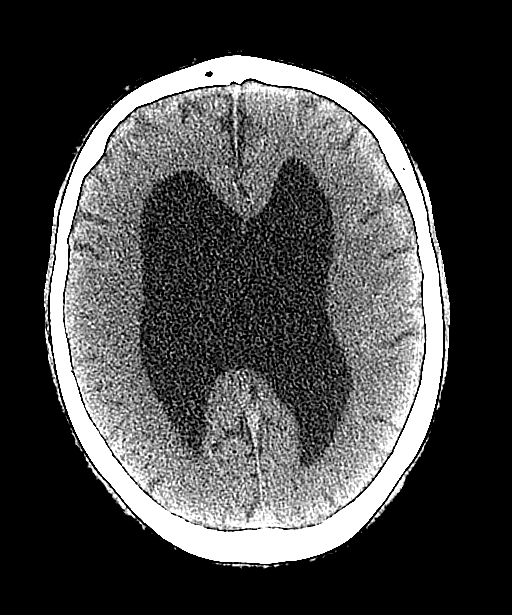
[im 43/78  bone]
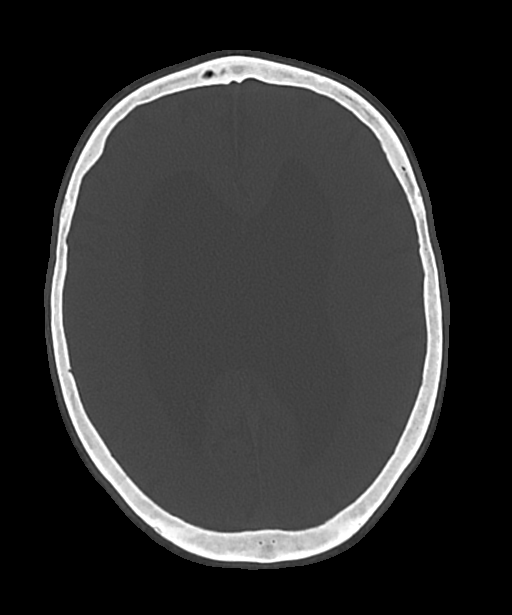
[im 54/78  brain]
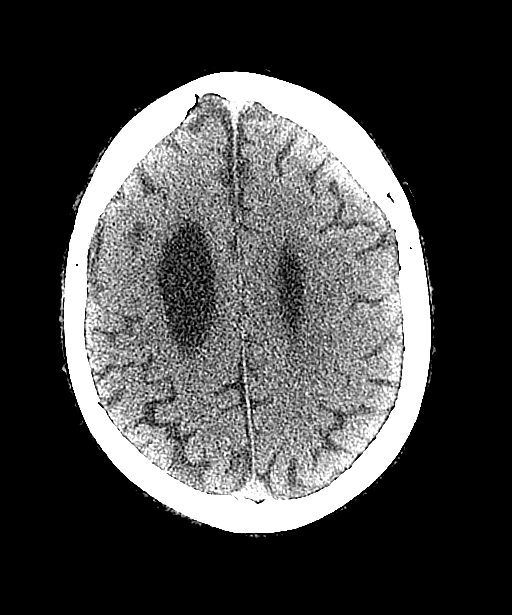
[im 62/78  brain]
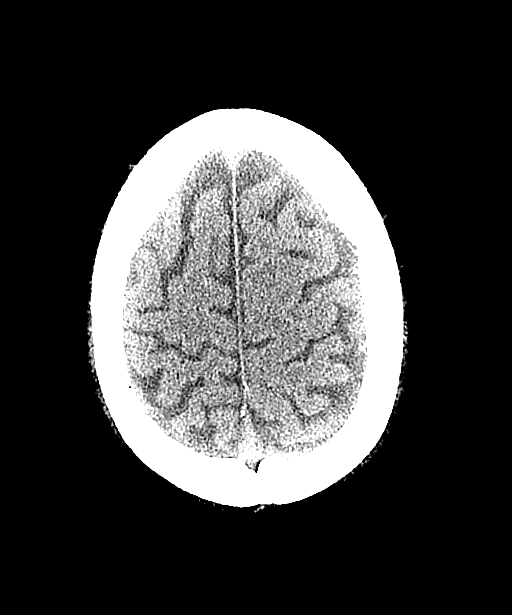
[im 70/78  brain]
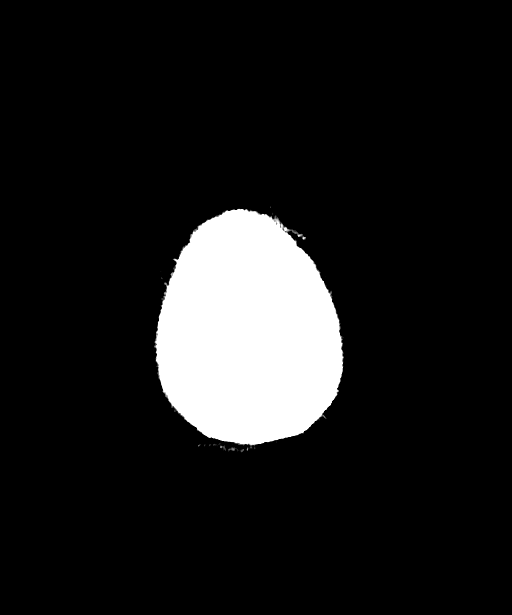

[14 of 47 positions shown; findings below may reference images not displayed]

DIAGNOSTIC STUDIES

EXAM

COMPUTED TOMOGRAPHY, HEAD OR BRAIN; WITHOUT CONTRAST MATERIAL, CPT 50140

INDICATION

dizziness/lightheaded. h/o stroke
Pt c/o dizziness and lightheadedness. H/o CVA in 4392. CT/NM 3/0. CF/AC

TECHNIQUE

Non-contrast head CT was performed with 5mm axial images.

All CT scans at this facility use dose modulation, iterative reconstruction, and/or weight based
dosing when appropriate to reduce radiation dose to as low as reasonably achievable.

0 CT and 0 nuclear scans in the last year.

COMPARISONS

No priors available for comparison.

FINDINGS

Chronic moderate ventricular dilatation. Chronic lacunar infarct within the right basal ganglia.
There is no evidence of acute hemorrhage, mass effect of intra/extra-axial fluid collections.
Gray/white matter differentiations is preserved. There are no acute displaced fractures. The
visualized paranasal sinuses are well aerated.

IMPRESSION

1. Chronic moderate ventricular dilatation. Chronic lacunar infarct within the right basal ganglia.

2. No acute hemorrhage, mass effect or intra/extra-axial fluid collections.

Tech Notes:

Pt c/o dizziness and lightheadedness. H/o CVA in 4392. CT/NM 3/0. CF/AC

## 2024-04-12 IMAGING — CR [ID]
1 series · 1 of 1 positions shown · non-contrast
Comparison: none

[x chest ap]
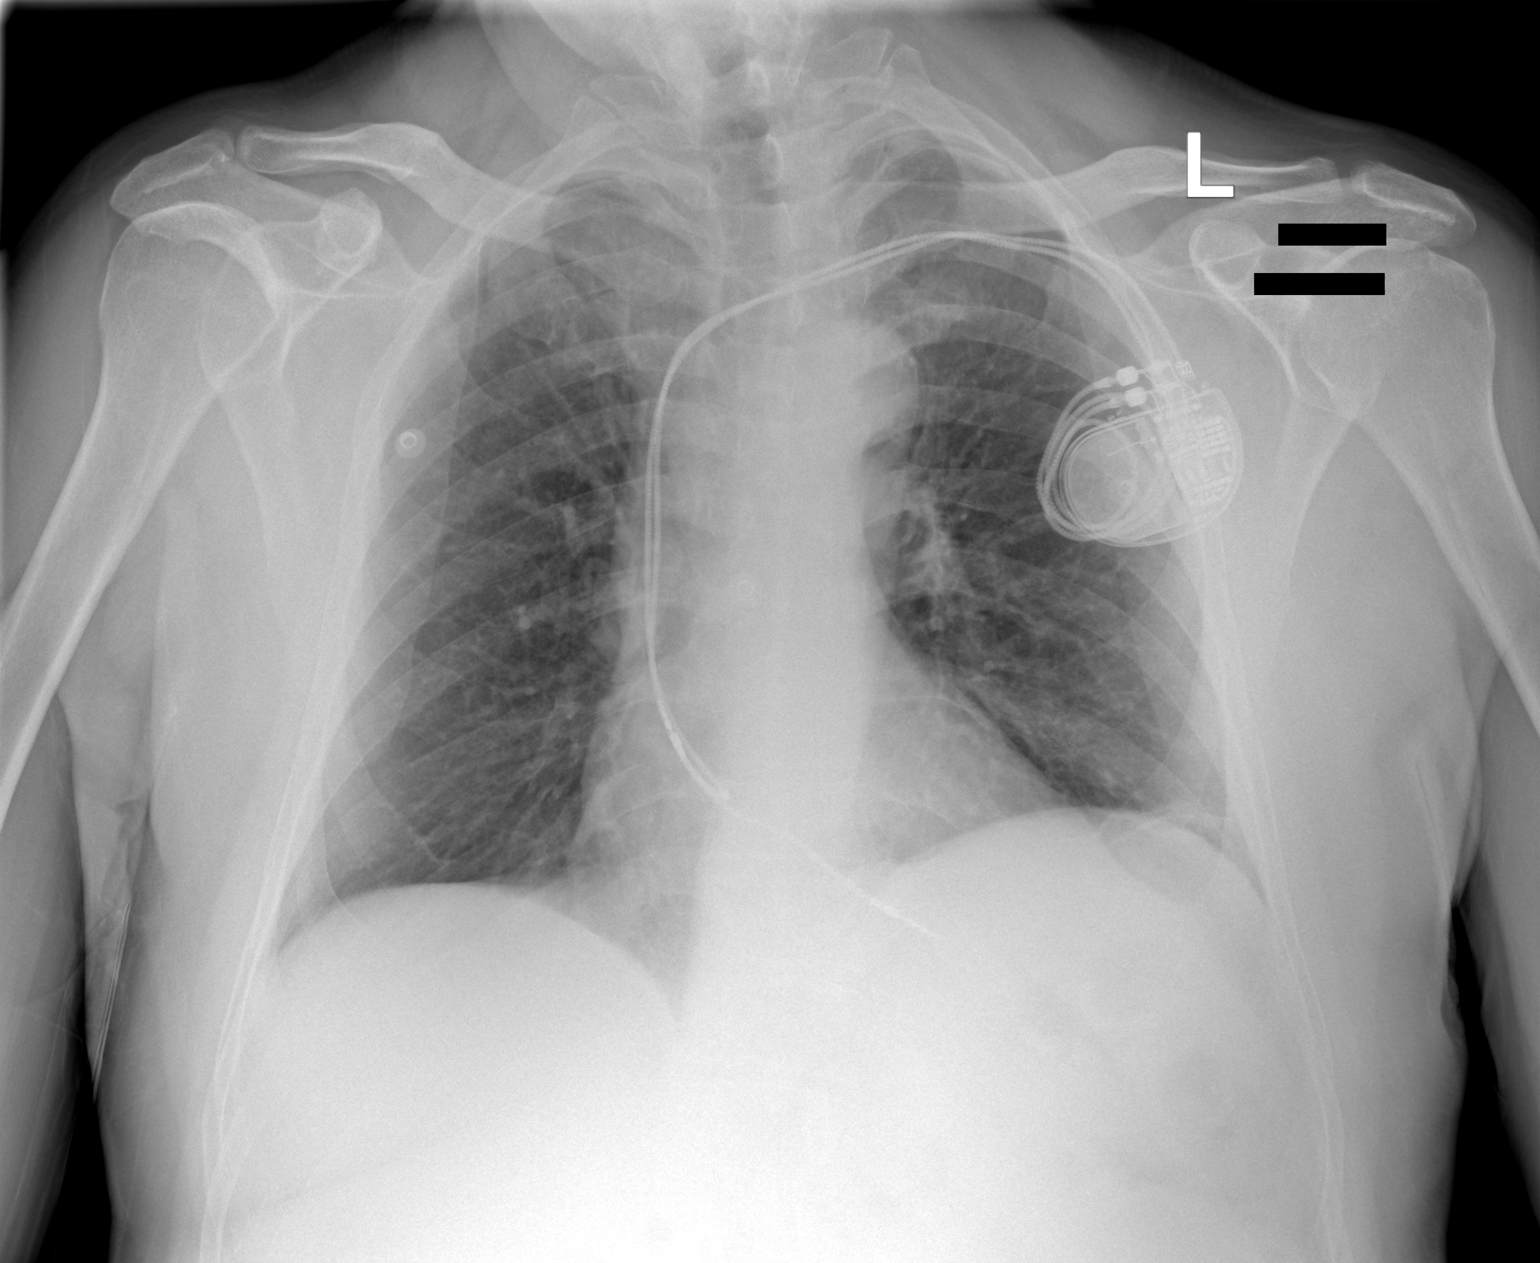

[1 of 1 positions shown; findings below may reference images not displayed]

EXAM

RADIOLOGICAL EXAMINATION, CHEST; SINGLE VIEW, FRONTAL CPT 36060

INDICATION

chest pain
Pt c/o dizziness and lightheadedness. H/o Afib, CAD, aortic valve replacement, pacemaker, and CVA in
2544. CF/AC

TECHNIQUE

Single PA portable view of the chest was performed.

COMPARISONS

12/05/2021

FINDINGS

Cardiomegaly. Left-sided permanent pacemaker. Mild left hilar prominence. No gross pneumothorax. No
acute fracture. Limited evaluation of the thoracic spine.

IMPRESSION

No dense consolidation.

Tech Notes:

Pt c/o dizziness and lightheadedness. H/o Afib, CAD, aortic valve replacement, pacemaker, and CVA in
2544. CF/AC

## 2024-04-29 ENCOUNTER — Encounter: Admit: 2024-04-29 | Discharge: 2024-04-29 | Payer: MEDICARE

## 2024-04-30 ENCOUNTER — Encounter: Admit: 2024-04-30 | Discharge: 2024-04-30 | Payer: MEDICARE

## 2024-04-30 ENCOUNTER — Ambulatory Visit: Admit: 2024-04-30 | Discharge: 2024-04-30 | Payer: MEDICARE

## 2024-04-30 DIAGNOSIS — R079 Chest pain, unspecified: Secondary | ICD-10-CM

## 2024-04-30 DIAGNOSIS — R001 Bradycardia, unspecified: Principal | ICD-10-CM

## 2024-04-30 DIAGNOSIS — I10 Essential (primary) hypertension: Secondary | ICD-10-CM

## 2024-04-30 DIAGNOSIS — R002 Palpitations: Secondary | ICD-10-CM

## 2024-04-30 DIAGNOSIS — I739 Peripheral vascular disease, unspecified: Secondary | ICD-10-CM

## 2024-04-30 NOTE — Patient Instructions
 Thank you for visiting our office today.    We would like to make the following medication adjustments:  NONE       Otherwise continue the same medications as you have been doing.          We will be pursuing the following tests after your appointment today:            We will plan to see you back in 6 months.  Please call us in the meantime with any questions or concerns.        Please allow 5-7 business days for our providers to review your results. All normal results will go to MyChart. If you do not have Mychart, it is strongly recommended to get this so you can easily view all your results. If you do not have mychart, we will attempt to call you once with normal lab and testing results. If we cannot reach you by phone with normal results, we will send you a letter.  If you have not heard the results of your testing after one week please give [place] a call.       Your Cardiovascular Medicine [place] Team Brett Canales, Pilar Jarvis, Shawna Orleans, and [place])  phone number is [phone].

## 2024-05-02 ENCOUNTER — Encounter: Admit: 2024-05-02 | Discharge: 2024-05-02 | Payer: MEDICARE

## 2024-05-02 NOTE — Progress Notes
 Received following request for cardiac risk assessment. Patient's office visit notes from 04/30/24 with Dr. Debroah faxed to [phone] 05/02/2024 9:00 AM

## 2024-07-02 ENCOUNTER — Encounter: Admit: 2024-07-02 | Discharge: 2024-07-02 | Payer: MEDICARE

## 2024-07-02 MED ORDER — METOPROLOL TARTRATE 25 MG PO TAB
ORAL_TABLET | ORAL | 11 refills | 90.00000 days | Status: AC
Start: 2024-07-02 — End: ?

## 2024-10-11 ENCOUNTER — Encounter: Admit: 2024-10-11 | Discharge: 2024-10-11 | Payer: MEDICARE

## 2024-11-01 ENCOUNTER — Encounter: Admit: 2024-11-01 | Discharge: 2024-11-01 | Payer: MEDICARE

## 2024-11-04 ENCOUNTER — Encounter: Admit: 2024-11-04 | Discharge: 2024-11-04 | Payer: MEDICARE

## 2024-11-05 ENCOUNTER — Encounter: Admit: 2024-11-05 | Discharge: 2024-11-05 | Payer: MEDICARE

## 2024-11-05 DIAGNOSIS — I495 Sick sinus syndrome: Secondary | ICD-10-CM

## 2024-11-05 DIAGNOSIS — R002 Palpitations: Secondary | ICD-10-CM

## 2024-11-05 DIAGNOSIS — I745 Embolism and thrombosis of iliac artery: Principal | ICD-10-CM

## 2024-11-05 DIAGNOSIS — Z950 Presence of cardiac pacemaker: Secondary | ICD-10-CM

## 2024-11-05 DIAGNOSIS — I10 Essential (primary) hypertension: Secondary | ICD-10-CM

## 2024-11-05 DIAGNOSIS — E785 Hyperlipidemia, unspecified: Secondary | ICD-10-CM

## 2024-11-05 DIAGNOSIS — R079 Chest pain, unspecified: Secondary | ICD-10-CM

## 2024-11-05 MED ORDER — EZETIMIBE 10 MG PO TAB
10 mg | ORAL_TABLET | Freq: Every day | ORAL | 1 refills | 90.00000 days | Status: AC
Start: 2024-11-05 — End: ?
